# Patient Record
Sex: Male | Born: 1970 | Race: Black or African American | Hispanic: No | Marital: Single | Smoking: Never smoker
Health system: Southern US, Community
[De-identification: ages and names within clinical notes are randomized; demographics above are authoritative.]

## PROBLEM LIST (undated history)

## (undated) DIAGNOSIS — K219 Gastro-esophageal reflux disease without esophagitis: Secondary | ICD-10-CM

## (undated) DIAGNOSIS — I10 Essential (primary) hypertension: Secondary | ICD-10-CM

---

## 2004-08-12 ENCOUNTER — Emergency Department (HOSPITAL_COMMUNITY): Admission: EM | Admit: 2004-08-12 | Discharge: 2004-08-12 | Payer: Self-pay | Admitting: Emergency Medicine

## 2008-08-28 ENCOUNTER — Encounter: Admission: RE | Admit: 2008-08-28 | Discharge: 2008-08-28 | Payer: Self-pay | Admitting: Family Medicine

## 2009-10-27 ENCOUNTER — Emergency Department (HOSPITAL_COMMUNITY): Admission: EM | Admit: 2009-10-27 | Discharge: 2009-10-27 | Payer: Self-pay | Admitting: Emergency Medicine

## 2009-10-29 ENCOUNTER — Emergency Department (HOSPITAL_COMMUNITY): Admission: EM | Admit: 2009-10-29 | Discharge: 2009-10-29 | Payer: Self-pay | Admitting: Emergency Medicine

## 2010-06-10 IMAGING — CT CT ABDOMEN W/O CM
2 of 4 series · 17 of 46 positions shown, 19 images · non-contrast
Comparison: None available.

CT ABDOMEN

CLINICAL DATA: Right flank pain.  Microscopic hematuria.

CT ABDOMEN AND PELVIS WITHOUT CONTRAST
TECHNIQUE: Multidetector CT imaging of the abdomen and pelvis was
performed following the standard protocol without intravenous
contrast.

[Series 2: abd/pelvis without · axial · non-contrast · 0.65mm/px · z∈[-295,+40]mm · 14 of 72 slices shown, 16 images]
[im 3/72  soft-tissue]
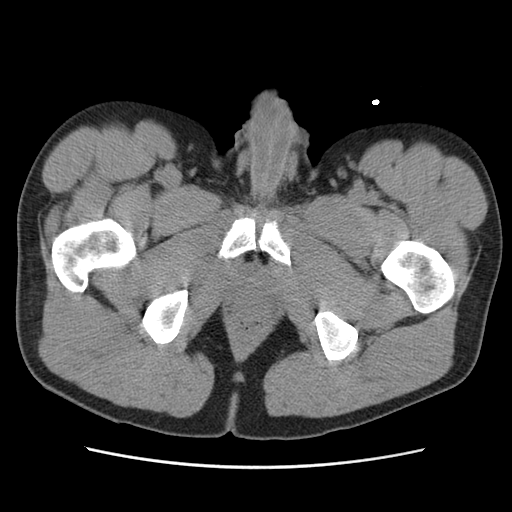
[im 3/72  bone]
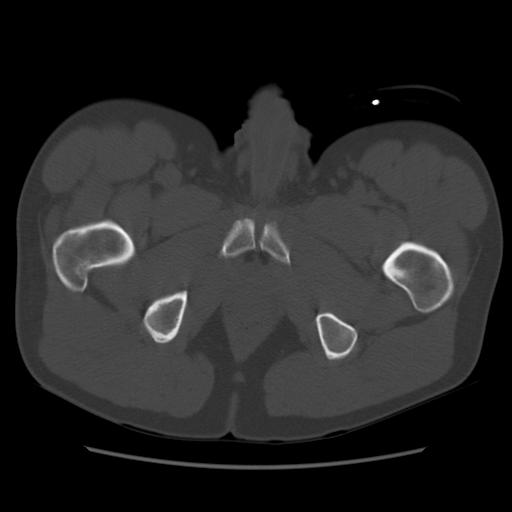
[im 9/72  soft-tissue]
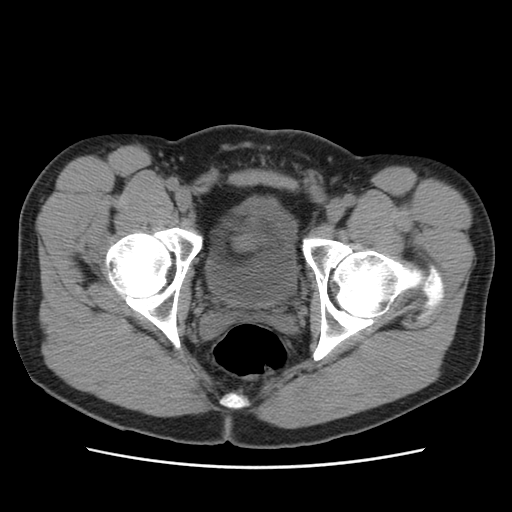
[im 15/72  soft-tissue]
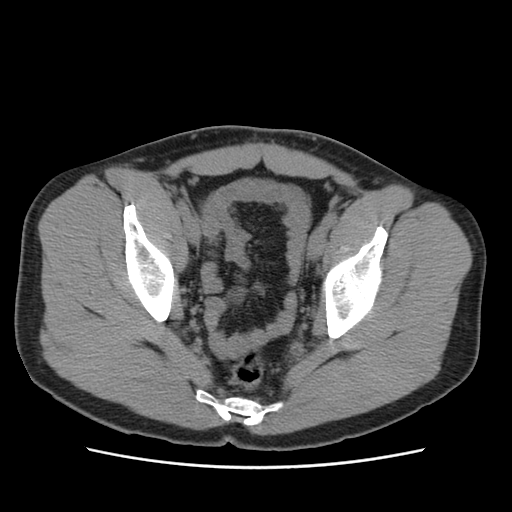
[im 20/72  soft-tissue]
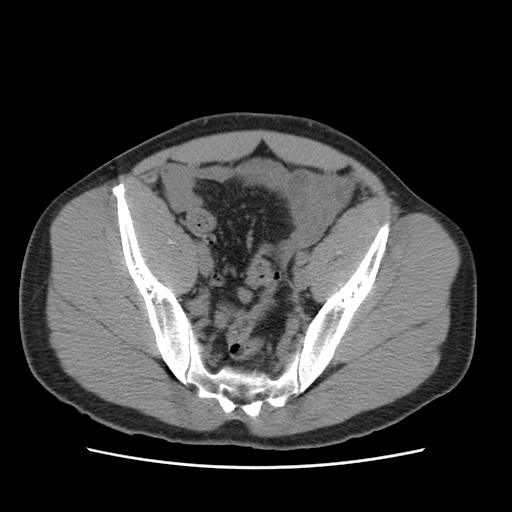
[im 23/72  soft-tissue]
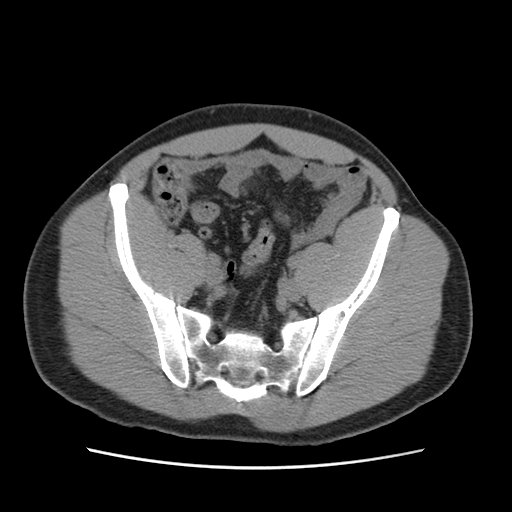
[im 29/72  soft-tissue]
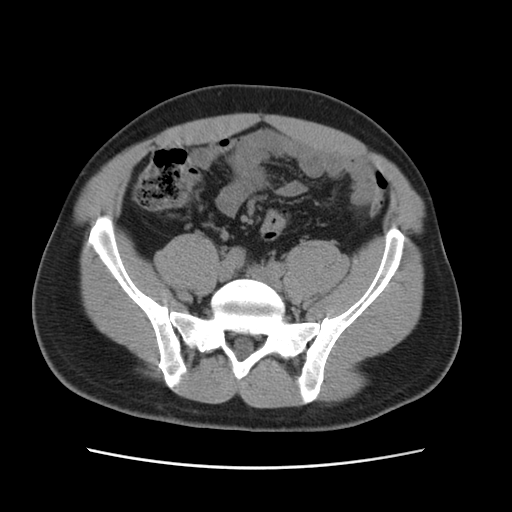
[im 35/72  soft-tissue]
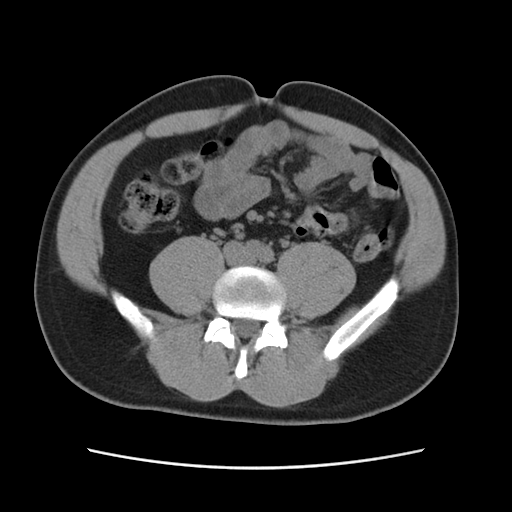
[im 37/72  soft-tissue]
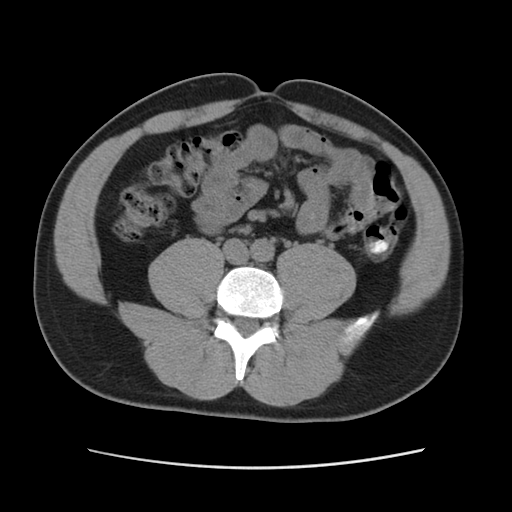
[im 43/72  soft-tissue]
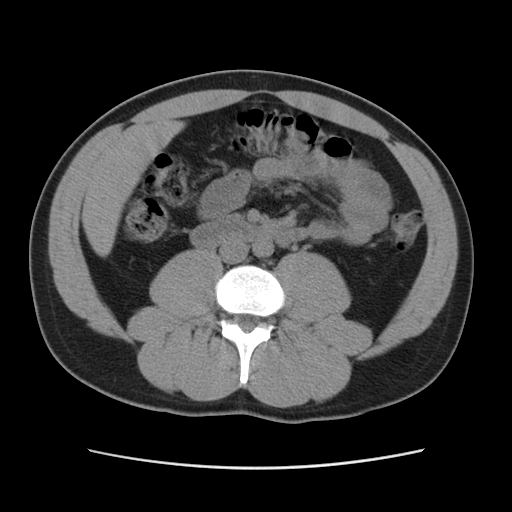
[im 43/72  bone]
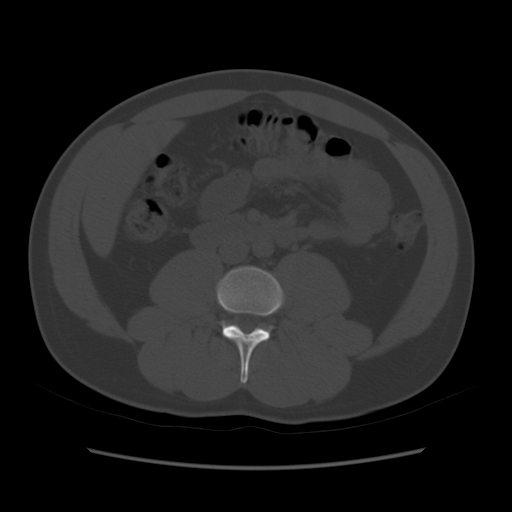
[im 49/72  soft-tissue]
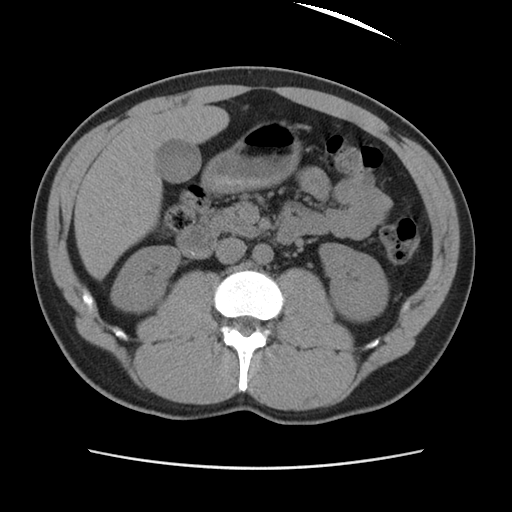
[im 54/72  soft-tissue]
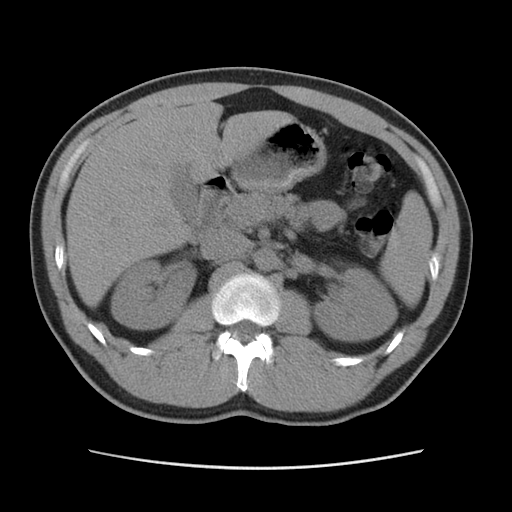
[im 57/72  soft-tissue]
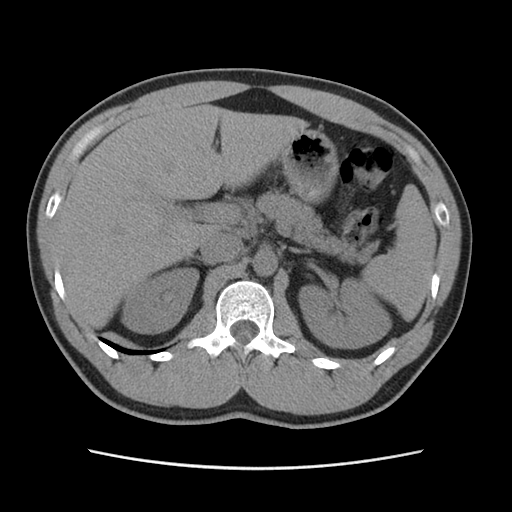
[im 63/72  soft-tissue]
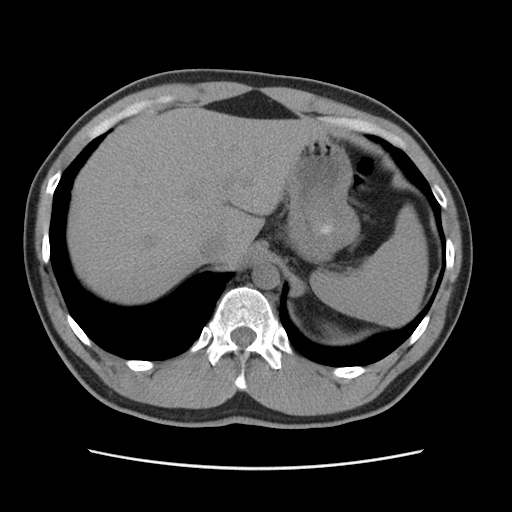
[im 69/72  soft-tissue]
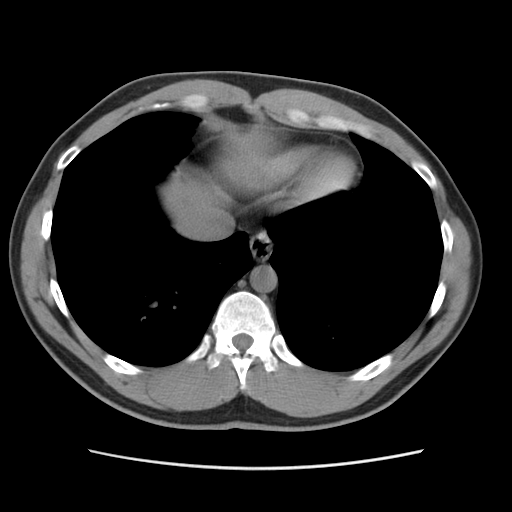

[Series 400: cor · coronal · 0.80mm/px · 3 of 100 slices shown]
[im 34/100  soft-tissue]
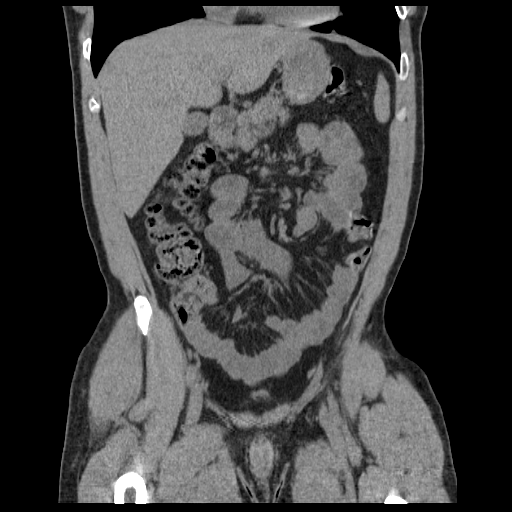
[im 45/100  soft-tissue]
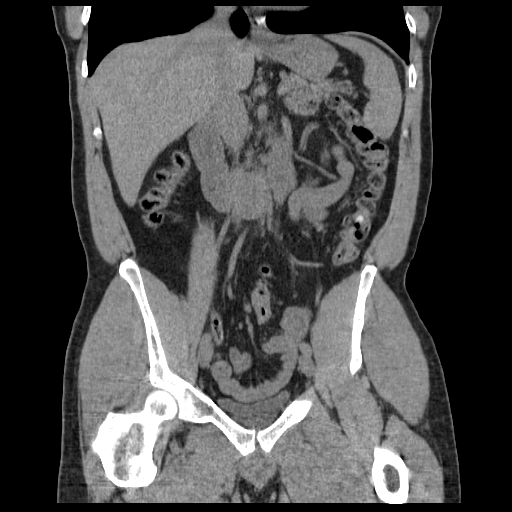
[im 56/100  soft-tissue]
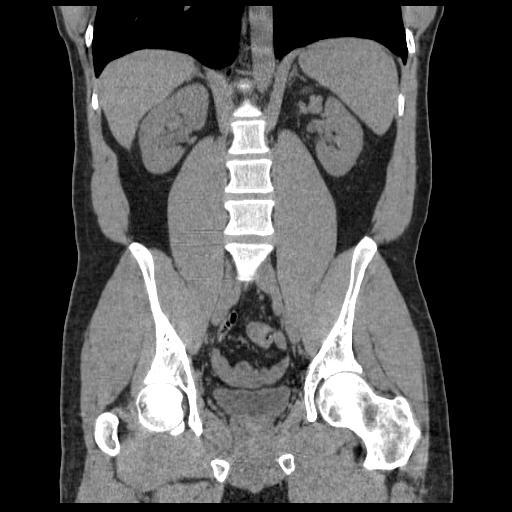

[17 of 46 positions shown; findings below may reference images not displayed]

FINDINGS: The lung bases are clear without focal nodule, mass, or
airspace disease.  Heart size is normal.  There is no significant
pleural or pericardial effusion.

The liver and spleen are normal.  The stomach, duodenum, and
pancreas are within normal limits.  Common bile duct and
gallbladder are normal.  The adrenal glands are normal bilaterally.
There is no evidence for right-sided nephrolithiasis.  There is no
hydronephrosis.  A punctate nonobstructive stone is present in the
left kidney.  There is no obstruction on the left.  There is no
significant abdominal lymphadenopathy or free fluid.  Bone windows
are unremarkable.
IMPRESSION: 1.  Single punctate nonobstructive left kidney stone.
2.  No evidence for right-sided nephrolithiasis or hydronephrosis.

CT PELVIS
FINDINGS: The rectosigmoid colon is within normal limits.  The
remainder of the colon is normal.  The appendix is visualized and
within normal limits.  The terminal ileum is unremarkable.  The
small bowel is within normal limits.  Urinary bladder is normal.
There is no significant pelvic lymphadenopathy or free fluid. Bone
windows are unremarkable.
IMPRESSION: No acute abnormality of the pelvis.

## 2010-07-11 LAB — URINALYSIS, ROUTINE W REFLEX MICROSCOPIC
Bilirubin Urine: NEGATIVE
Hgb urine dipstick: NEGATIVE
Nitrite: NEGATIVE
Protein, ur: NEGATIVE mg/dL
Urobilinogen, UA: 1 mg/dL (ref 0.0–1.0)

## 2010-07-11 LAB — POCT CARDIAC MARKERS
CKMB, poc: <1 ng/mL — ABNORMAL LOW (ref 1.0–8.0)
Myoglobin, poc: 66.7 ng/mL (ref 12–200)
Troponin i, poc: <0.05 ng/mL (ref 0.00–0.09)

## 2010-07-11 LAB — COMPREHENSIVE METABOLIC PANEL
AST: 47 U/L — ABNORMAL HIGH (ref 0–37)
Albumin: 4.4 g/dL (ref 3.5–5.2)
Alkaline Phosphatase: 60 U/L (ref 39–117)
BUN: 13 mg/dL (ref 6–23)
CO2: 25 mEq/L (ref 19–32)
Chloride: 104 mEq/L (ref 96–112)
Creatinine, Ser: 0.99 mg/dL (ref 0.4–1.5)
Glucose, Bld: 104 mg/dL — ABNORMAL HIGH (ref 70–99)

## 2010-07-11 LAB — DIFFERENTIAL
Basophils Absolute: 0 10*3/uL (ref 0.0–0.1)
Basophils Relative: 1 % (ref 0–1)
Eosinophils Absolute: 0.1 10*3/uL (ref 0.0–0.7)
Monocytes Relative: 8 % (ref 3–12)
Neutrophils Relative %: 60 % (ref 43–77)

## 2010-07-11 LAB — CBC
HCT: 43.6 % (ref 39.0–52.0)
MCV: 94.1 fL (ref 78.0–100.0)

## 2010-09-19 ENCOUNTER — Emergency Department (HOSPITAL_COMMUNITY): Payer: BC Managed Care – PPO

## 2010-09-19 ENCOUNTER — Emergency Department (HOSPITAL_COMMUNITY)
Admission: EM | Admit: 2010-09-19 | Discharge: 2010-09-19 | Disposition: A | Discharge status: Home / Self Care | Payer: BC Managed Care – PPO | Attending: Emergency Medicine | Admitting: Emergency Medicine

## 2010-09-19 DIAGNOSIS — S8990XA Unspecified injury of unspecified lower leg, initial encounter: Secondary | ICD-10-CM | POA: Insufficient documentation

## 2010-09-19 DIAGNOSIS — Y9367 Activity, basketball: Secondary | ICD-10-CM | POA: Insufficient documentation

## 2010-09-19 DIAGNOSIS — M79609 Pain in unspecified limb: Secondary | ICD-10-CM | POA: Insufficient documentation

## 2010-09-19 DIAGNOSIS — Y9239 Other specified sports and athletic area as the place of occurrence of the external cause: Secondary | ICD-10-CM | POA: Insufficient documentation

## 2010-09-19 DIAGNOSIS — X500XXA Overexertion from strenuous movement or load, initial encounter: Secondary | ICD-10-CM | POA: Insufficient documentation

## 2010-10-22 ENCOUNTER — Emergency Department (HOSPITAL_COMMUNITY)
Admission: EM | Admit: 2010-10-22 | Discharge: 2010-10-22 | Disposition: A | Discharge status: Home / Self Care | Payer: BC Managed Care – PPO | Attending: Emergency Medicine | Admitting: Emergency Medicine

## 2010-10-22 DIAGNOSIS — R109 Unspecified abdominal pain: Secondary | ICD-10-CM | POA: Insufficient documentation

## 2010-10-22 DIAGNOSIS — M549 Dorsalgia, unspecified: Secondary | ICD-10-CM | POA: Insufficient documentation

## 2010-10-22 DIAGNOSIS — K5900 Constipation, unspecified: Secondary | ICD-10-CM | POA: Insufficient documentation

## 2010-10-22 LAB — URINALYSIS, ROUTINE W REFLEX MICROSCOPIC
Leukocytes, UA: NEGATIVE
Urobilinogen, UA: 1 mg/dL (ref 0.0–1.0)

## 2014-07-17 ENCOUNTER — Emergency Department (HOSPITAL_COMMUNITY): Payer: BLUE CROSS/BLUE SHIELD

## 2014-07-17 ENCOUNTER — Encounter (HOSPITAL_COMMUNITY): Payer: Self-pay

## 2014-07-17 ENCOUNTER — Emergency Department (HOSPITAL_COMMUNITY): Admission: EM | Admit: 2014-07-17 | Discharge: 2014-07-17 | Discharge status: Against Medical Advice | Payer: Self-pay

## 2014-07-17 ENCOUNTER — Emergency Department (HOSPITAL_COMMUNITY)
Admission: EM | Admit: 2014-07-17 | Discharge: 2014-07-18 | Disposition: A | Discharge status: Home / Self Care | Payer: BLUE CROSS/BLUE SHIELD | Attending: Emergency Medicine | Admitting: Emergency Medicine

## 2014-07-17 DIAGNOSIS — J159 Unspecified bacterial pneumonia: Secondary | ICD-10-CM | POA: Diagnosis not present

## 2014-07-17 DIAGNOSIS — K219 Gastro-esophageal reflux disease without esophagitis: Secondary | ICD-10-CM | POA: Diagnosis not present

## 2014-07-17 DIAGNOSIS — R05 Cough: Secondary | ICD-10-CM | POA: Diagnosis present

## 2014-07-17 DIAGNOSIS — Z791 Long term (current) use of non-steroidal anti-inflammatories (NSAID): Secondary | ICD-10-CM | POA: Diagnosis not present

## 2014-07-17 DIAGNOSIS — I10 Essential (primary) hypertension: Secondary | ICD-10-CM | POA: Insufficient documentation

## 2014-07-17 DIAGNOSIS — Z79899 Other long term (current) drug therapy: Secondary | ICD-10-CM | POA: Insufficient documentation

## 2014-07-17 DIAGNOSIS — J189 Pneumonia, unspecified organism: Secondary | ICD-10-CM

## 2014-07-17 DIAGNOSIS — R000 Tachycardia, unspecified: Secondary | ICD-10-CM | POA: Insufficient documentation

## 2014-07-17 HISTORY — DX: Essential (primary) hypertension: I10

## 2014-07-17 HISTORY — DX: Gastro-esophageal reflux disease without esophagitis: K21.9

## 2014-07-17 LAB — CBC
HEMATOCRIT: 43.3 % (ref 39.0–52.0)
HEMOGLOBIN: 15.2 g/dL (ref 13.0–17.0)
MCH: 31.8 pg (ref 26.0–34.0)
MCHC: 35.1 g/dL (ref 30.0–36.0)
MCV: 90.6 fL (ref 78.0–100.0)
Platelets: 161 10*3/uL (ref 150–400)
RBC: 4.78 MIL/uL (ref 4.22–5.81)
RDW: 12.3 % (ref 11.5–15.5)
WBC: 13.8 10*3/uL — AB (ref 4.0–10.5)

## 2014-07-17 LAB — BASIC METABOLIC PANEL
ANION GAP: 12 (ref 5–15)
BUN: 15 mg/dL (ref 6–23)
CALCIUM: 9.6 mg/dL (ref 8.4–10.5)
CHLORIDE: 101 mmol/L (ref 96–112)
CO2: 22 mmol/L (ref 19–32)
CREATININE: 1.17 mg/dL (ref 0.50–1.35)
GFR, EST AFRICAN AMERICAN: 87 mL/min — AB (ref >90)
GFR, EST NON AFRICAN AMERICAN: 75 mL/min — AB (ref >90)
Glucose, Bld: 131 mg/dL — ABNORMAL HIGH (ref 70–99)
Potassium: 3.5 mmol/L (ref 3.5–5.1)
Sodium: 135 mmol/L (ref 135–145)

## 2014-07-17 LAB — TROPONIN I

## 2014-07-17 LAB — I-STAT TROPONIN, ED: Troponin i, poc: 0 ng/mL (ref 0.00–0.08)

## 2014-07-17 MED ORDER — DEXTROSE 5 % IV SOLN
1.0000 g | Freq: Once | INTRAVENOUS | Status: AC
  Administered 2014-07-17: 1 g via INTRAVENOUS
  Filled 2014-07-17: qty 10

## 2014-07-17 MED ORDER — ACETAMINOPHEN 325 MG PO TABS
650.0000 mg | ORAL_TABLET | Freq: Once | ORAL | Status: AC
  Administered 2014-07-17: 650 mg via ORAL
  Filled 2014-07-17: qty 2

## 2014-07-17 MED ORDER — AZITHROMYCIN 500 MG IV SOLR
500.0000 mg | Freq: Once | INTRAVENOUS | Status: AC
  Administered 2014-07-17: 500 mg via INTRAVENOUS
  Filled 2014-07-17: qty 500

## 2014-07-17 MED ORDER — AZITHROMYCIN 250 MG PO TABS: 250.0000 mg | ORAL_TABLET | Freq: Every day | ORAL | Status: DC

## 2014-07-17 MED ORDER — SODIUM CHLORIDE 0.9 % IV BOLUS (SEPSIS)
1000.0000 mL | Freq: Once | INTRAVENOUS | Status: AC
  Administered 2014-07-17: 1000 mL via INTRAVENOUS

## 2014-07-17 NOTE — ED Notes (Signed)
Patient reports he had a colonoscopy today and had a cough afterwards.  He was advised to seek medical attention if cough worsens or he developed new symptoms.  He reports cough and fever this evening.

## 2014-07-17 NOTE — Discharge Instructions (Signed)

## 2014-07-23 NOTE — ED Provider Notes (Signed)
CSN: [id number]     Arrival date & time 07/17/14  2107 History   First MD Initiated Contact with Patient 07/17/14 2153     Chief Complaint  Patient presents with  . Cough     (Consider location/radiation/quality/duration/timing/severity/associated sxs/prior Treatment) HPI   44 year old male presenting with cough and fever. Onset earlier today. Symptoms worsening after colonoscopy. He states that he felt more or less in his usual state of health prior to the procedure. Cough is occasionally productive for whitish sputum. Subjective fever. No nausea or vomiting. No unusual leg pain or swelling. Denies past history of DVT or PE. Nonsmoker.  Past Medical History  Diagnosis Date  . Hypertension   . GERD (gastroesophageal reflux disease)    History reviewed. No pertinent past surgical history. History reviewed. No pertinent family history. History  Substance Use Topics  . Smoking status: Never Smoker   . Smokeless tobacco: Not on file  . Alcohol Use: Yes     Comment: occ    Review of Systems  All systems reviewed and negative, other than as noted in HPI.   Allergies  Review of patient's allergies indicates no known allergies.  Home Medications   Prior to Admission medications   Medication Sig Start Date End Date Taking? Authorizing Provider  esomeprazole (NEXIUM) 40 MG capsule Take 40 mg by mouth daily at 12 noon.   Yes Historical Provider, MD  lisinopril (PRINIVIL,ZESTRIL) 10 MG tablet Take 10 mg by mouth daily.   Yes Historical Provider, MD  naproxen sodium (ANAPROX) 220 MG tablet Take 220 mg by mouth daily as needed (headache).   Yes Historical Provider, MD  azithromycin (ZITHROMAX) 250 MG tablet Take 1 tablet (250 mg total) by mouth daily. 07/17/14   Raeford Razor, MD   BP 124/64 mmHg  Pulse 117  Temp(Src) 99.8 F (37.7 C) (Oral)  Resp 18  Ht  (1.6 m)  Wt 145 lb (65.772 kg)  BMI 25.69 kg/m2  SpO2 97% Physical Exam  Constitutional: He appears well-developed  and well-nourished. No distress.  HENT:  Head: Normocephalic and atraumatic.  Eyes: Conjunctivae are normal. Right eye exhibits no discharge. Left eye exhibits no discharge.  Neck: Neck supple.  Cardiovascular: Regular rhythm and normal heart sounds.  Exam reveals no gallop and no friction rub.   No murmur heard. Tachycardic  Pulmonary/Chest: Effort normal and breath sounds normal. No respiratory distress. He has no wheezes.  Sitting up in bed. Appears comfortable. Speaks in complete sentences. No accessory muscle usage.  Abdominal: Soft. He exhibits no distension. There is no tenderness.  Musculoskeletal: He exhibits no edema or tenderness.  Lower extremities symmetric as compared to each other. No calf tenderness. Negative Homan's. No palpable cords.   Neurological: He is alert.  Skin: Skin is warm and dry.  Psychiatric: He has a normal mood and affect. His behavior is normal. Thought content normal.  Nursing note and vitals reviewed.   ED Course  Procedures (including critical care time) Labs Review Labs Reviewed  BASIC METABOLIC PANEL - Abnormal; Notable for the following:    Glucose, Bld 131 (*)    GFR calc non Af Amer 75 (*)    GFR calc Af Amer 87 (*)    All other components within normal limits  CBC - Abnormal; Notable for the following:    WBC 13.8 (*)    All other components within normal limits  TROPONIN I  I-STAT TROPOININ, ED    Imaging Review No results found.  Dg Chest 2 View (if Patient Has Fever And/or Copd)  07/17/2014   CLINICAL DATA:  Acute onset of cough, congestion and chest pain. Shortness of breath. Initial encounter.  EXAM: CHEST  2 VIEW  COMPARISON:  Chest radiograph performed 10/27/2009  FINDINGS: The lungs are well-aerated. Left basilar opacity likely reflects pneumonia. There is no evidence of pleural effusion or pneumothorax.  The heart is normal in size; the mediastinal contour is within normal limits. No acute osseous abnormalities are seen.   IMPRESSION: Left basilar airspace opacity likely reflects pneumonia.   Electronically Signed   By: Roanna RaiderJeffery  Chang M.D.   On: 07/17/2014 22:21    EKG Interpretation   Date/Time:  Thursday July 17 2014 21:46:49 EDT Ventricular Rate:  133 PR Interval:  97 QRS Duration: 97 QT Interval:  309 QTC Calculation: 460 R Axis:   50 Text Interpretation:  Sinus tachycardia Ventricular premature complex  Aberrant complex Nonspecific T abnormalities, diffuse leads ED PHYSICIAN  INTERPRETATION AVAILABLE IN [facility] Confirmed by TEST, Record  ([id number]) on 07/19/2014 8:33:00 AM      MDM   Final diagnoses:  CAP (community acquired pneumonia)    44 year old male with cough and fever beginning shortly after colonoscopy. Suspect coincidental timing to recent procedure. Clinical picture consistent with pneumonia. Chest x-ray shows a left-sided infiltrate. Patient arrived tachycardic which has since improved. No respiratory distress. Speaking in complete sentences. No increased work of breathing. Oxygen saturations were normal on room air. He has no history of chronic underlying lung disease or other significant comorbidities. His renal function is ok. I feel he is appropriate for outpatient treatment.    Raeford RazorStephen Dotty Gonzalo, MD 07/23/14 [phone]

## 2017-07-14 ENCOUNTER — Emergency Department (HOSPITAL_COMMUNITY)
Admission: EM | Admit: 2017-07-14 | Discharge: 2017-07-14 | Disposition: A | Discharge status: Home / Self Care | Payer: No Typology Code available for payment source | Attending: Emergency Medicine | Admitting: Emergency Medicine

## 2017-07-14 ENCOUNTER — Encounter (HOSPITAL_COMMUNITY): Payer: Self-pay

## 2017-07-14 ENCOUNTER — Emergency Department (HOSPITAL_COMMUNITY): Payer: No Typology Code available for payment source

## 2017-07-14 ENCOUNTER — Other Ambulatory Visit: Payer: Self-pay

## 2017-07-14 DIAGNOSIS — Y939 Activity, unspecified: Secondary | ICD-10-CM | POA: Diagnosis not present

## 2017-07-14 DIAGNOSIS — Y999 Unspecified external cause status: Secondary | ICD-10-CM | POA: Insufficient documentation

## 2017-07-14 DIAGNOSIS — S161XXA Strain of muscle, fascia and tendon at neck level, initial encounter: Secondary | ICD-10-CM | POA: Diagnosis not present

## 2017-07-14 DIAGNOSIS — Z041 Encounter for examination and observation following transport accident: Secondary | ICD-10-CM | POA: Diagnosis present

## 2017-07-14 DIAGNOSIS — Y929 Unspecified place or not applicable: Secondary | ICD-10-CM | POA: Insufficient documentation

## 2017-07-14 DIAGNOSIS — M62830 Muscle spasm of back: Secondary | ICD-10-CM | POA: Insufficient documentation

## 2017-07-14 MED ORDER — IBUPROFEN 200 MG PO TABS
600.0000 mg | ORAL_TABLET | Freq: Once | ORAL | Status: AC
  Administered 2017-07-14: 600 mg via ORAL
  Filled 2017-07-14: qty 3

## 2017-07-14 MED ORDER — DICLOFENAC SODIUM 50 MG PO TBEC: 50.0000 mg | DELAYED_RELEASE_TABLET | Freq: Two times a day (BID) | ORAL | 0 refills | Status: AC

## 2017-07-14 MED ORDER — CYCLOBENZAPRINE HCL 10 MG PO TABS: 10.0000 mg | ORAL_TABLET | Freq: Two times a day (BID) | ORAL | 0 refills | Status: AC | PRN
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
## 2017-07-14 NOTE — Discharge Instructions (Addendum)
Do not take the muscle relaxer if driving as it will make you sleepy. Follow up with your doctor. Return here as needed.  °

## 2017-12-21 ENCOUNTER — Other Ambulatory Visit: Payer: Self-pay

## 2017-12-21 ENCOUNTER — Emergency Department (HOSPITAL_COMMUNITY)
Admission: EM | Admit: 2017-12-21 | Discharge: 2017-12-21 | Disposition: A | Discharge status: Home / Self Care | Payer: Managed Care, Other (non HMO) | Attending: Emergency Medicine | Admitting: Emergency Medicine

## 2017-12-21 ENCOUNTER — Encounter (HOSPITAL_COMMUNITY): Payer: Self-pay

## 2017-12-21 DIAGNOSIS — Y92410 Unspecified street and highway as the place of occurrence of the external cause: Secondary | ICD-10-CM | POA: Diagnosis not present

## 2017-12-21 DIAGNOSIS — M549 Dorsalgia, unspecified: Secondary | ICD-10-CM | POA: Insufficient documentation

## 2017-12-21 DIAGNOSIS — S5012XA Contusion of left forearm, initial encounter: Secondary | ICD-10-CM | POA: Diagnosis not present

## 2017-12-21 DIAGNOSIS — Y9389 Activity, other specified: Secondary | ICD-10-CM | POA: Insufficient documentation

## 2017-12-21 DIAGNOSIS — I10 Essential (primary) hypertension: Secondary | ICD-10-CM | POA: Insufficient documentation

## 2017-12-21 DIAGNOSIS — Y998 Other external cause status: Secondary | ICD-10-CM | POA: Diagnosis not present

## 2017-12-21 DIAGNOSIS — Z79899 Other long term (current) drug therapy: Secondary | ICD-10-CM | POA: Diagnosis not present

## 2017-12-21 DIAGNOSIS — S59912A Unspecified injury of left forearm, initial encounter: Secondary | ICD-10-CM | POA: Diagnosis present

## 2017-12-21 DIAGNOSIS — S5011XA Contusion of right forearm, initial encounter: Secondary | ICD-10-CM | POA: Insufficient documentation
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
## 2017-12-21 NOTE — Discharge Instructions (Addendum)
Ibuprofen for soreness.  Return if any problems.
